# Patient Record
Sex: Male | Born: 1960 | Race: White | Hispanic: No | Marital: Married | Smoking: Current every day smoker
Health system: Southern US, Community
[De-identification: ages and names within clinical notes are randomized; demographics above are authoritative.]

---

## 2008-01-17 ENCOUNTER — Emergency Department (HOSPITAL_COMMUNITY): Admission: EM | Admit: 2008-01-17 | Discharge: 2008-01-17 | Payer: Self-pay | Admitting: Internal Medicine

## 2008-03-02 ENCOUNTER — Ambulatory Visit (HOSPITAL_COMMUNITY): Admission: RE | Admit: 2008-03-02 | Discharge: 2008-03-02 | Payer: Self-pay | Admitting: Orthopaedic Surgery

## 2010-06-01 ENCOUNTER — Encounter: Payer: Self-pay | Admitting: Orthopaedic Surgery

## 2014-08-14 ENCOUNTER — Ambulatory Visit (INDEPENDENT_AMBULATORY_CARE_PROVIDER_SITE_OTHER): Payer: BLUE CROSS/BLUE SHIELD | Admitting: Internal Medicine

## 2014-08-14 VITALS — BP 130/82 | HR 67 | Temp 97.5°F | Resp 16 | Ht 70.5 in | Wt 192.0 lb

## 2014-08-14 DIAGNOSIS — L719 Rosacea, unspecified: Secondary | ICD-10-CM

## 2014-08-14 DIAGNOSIS — L299 Pruritus, unspecified: Secondary | ICD-10-CM | POA: Diagnosis not present

## 2014-08-14 MED ORDER — MINOCYCLINE HCL 100 MG PO CAPS
100.0000 mg | ORAL_CAPSULE | Freq: Two times a day (BID) | ORAL | Status: DC
Start: 2014-08-14 — End: 2015-01-22

## 2014-08-14 MED ORDER — METRONIDAZOLE 1 % EX GEL: Freq: Every day | CUTANEOUS | Status: DC

## 2014-08-14 NOTE — Patient Instructions (Signed)
Rosacea Rosacea is a long-term (chronic) condition that affects the skin of the face (cheeks, nose, brow, and chin) and sometimes the eyes. Rosacea causes the blood vessels near the surface of the skin to enlarge, resulting in redness. This condition usually begins after age 53. It occurs most often in light-skinned women. Without treatment, rosacea tends to get worse over time. There is no cure for rosacea, but treatment can help control your symptoms. CAUSES  The cause is unknown. It is thought that some people may inherit a tendency to develop rosacea. Certain triggers can make your rosacea worse, including:  Hot baths.  Exercise.  Sunlight.  Very hot or cold temperatures.  Hot or spicy foods and drinks.  Drinking alcohol.  Stress.  Taking blood pressure medicine.  Long-term use of topical steroids on the face. SYMPTOMS   Redness of the face.  Red bumps or pimples on the face.  Red, enlarged nose (rhinophyma).  Blushing easily.  Red lines on the skin.  Irritated or burning feeling in the eyes.  Swollen eyelids. DIAGNOSIS  Your caregiver can usually tell what is wrong by asking about your symptoms and performing a physical exam. TREATMENT  Avoiding triggers is an important part of treatment. You will also need to see a skin specialist (dermatologist) who can develop a treatment plan for you. The goals of treatment are to control your condition and to improve the appearance of your skin. It may take several weeks or months of treatment before you notice an improvement in your skin. Even after your skin improves, you will likely need to continue treatment to prevent your rosacea from coming back. Treatment methods may include:  Using sunscreen or sunblock daily to protect the skin.  Antibiotic medicine, such as metronidazole, applied directly to the skin.  Antibiotics taken by mouth. This is usually prescribed if you have eye problems from your rosacea.  Laser surgery  to improve the appearance of the skin. This surgery can reduce the appearance of red lines on the skin and can remove excess tissue from the nose to reduce its size. HOME CARE INSTRUCTIONS  Avoid things that seem to trigger your flare-ups.  If you are given antibiotics, take them as directed. Finish them even if you start to feel better.  Use a gentle facial cleanser that does not contain alcohol.  You may use a mild facial moisturizer.  Use a sunscreen or sunblock with SPF 30 or greater.  Wear a green-tinted foundation powder to conceal redness, if needed. Choose cosmetics that are noncomedogenic. This means they do not block your pores.  If your eyelids are affected, apply warm compresses to the eyelids. Do this up to 4 times a day or as directed by your caregiver. SEEK MEDICAL CARE IF:  Your skin problems get worse.  You feel depressed.  You lose your appetite.  You have trouble concentrating.  You have problems with your eyes, such as redness or itching. MAKE SURE YOU:  Understand these instructions.  Will watch your condition.  Will get help right away if you are not doing well or get worse. Document Released: 06/04/2004 Document Revised: 10/27/2011 Document Reviewed: 04/07/2011 ExitCare Patient Information 2015 [employer]. This information is not intended to replace advice given to you by your health care provider. Make sure you discuss any questions you have with your health care provider.  

## 2014-08-14 NOTE — Progress Notes (Signed)
   Subjective:    Patient ID: Mark Williamson, male    DOB: 1961-01-06, 54 y.o.   MRN: [id number]  HPI  Lorina RabonDavina Galloway, CMA is scribing for Dr. Perrin MalteseGuest on 08/14/2014. Edited by me, Stevin Bielinski  A 54 year old male is here with an off and on facial rash.  Patient states he has had the rash for at least 4-5 years but has been present for the past 2 weeks.  Patient states the rash comes and goes and he does not know what causes it.  Patient mentions that the rash can stay from 1-2 days to months at a time. He adds that the rash can often go months before returning. He mentions that the rash is always on his face and never spreads to the neck, head/hair or chest.   The patient adds that rash occasionally itches and he often uses OTC creams with no relief. Some of the creams include:  Lamisil, Cortisone and Sunscreen lotion.  He describes that an hot feeling is associated with the itching.  He adds he often has pustules on his face.     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  However, he denies any knowledge of any environmental factors contributing to the rash.  He believes that the rash has gotten worse over the years.  Patient was advised he may need to visit an Dermatologist to get to the underlying cause.  Review of Systems Dr. Lajoyce LauberHosani in [facility].    Objective:   Physical Exam  Constitutional: He is oriented to person, place, and time. He appears well-developed and well-nourished.  HENT:  Head: Normocephalic and atraumatic.    Mouth/Throat: Oropharynx is clear and moist.  Has beard, lesions in beard and above on exposed skin, red papules , nose indurated with weepin surface transudate  Eyes: EOM are normal.  Neck: Normal range of motion.  Pulmonary/Chest: Effort normal.  Neurological: He is alert and oriented to person, place, and time. He exhibits normal muscle tone. Coordination normal.  Skin: Lesion and rash noted. Rash is maculopapular, nodular and  pustular. There is erythema.  Psychiatric: He has a normal mood and affect. His behavior is normal. Judgment and thought content normal.          Assessment & Plan:  Acne Rosacea likely Minocycline 100mg  bid/Metrogel daily Use zinc containing sunscreen daily Return 3 months check response/if good reduce dose of minocycline to 50mg  bid

## 2014-08-22 ENCOUNTER — Ambulatory Visit: Payer: Self-pay | Admitting: Family Medicine

## 2015-01-12 ENCOUNTER — Ambulatory Visit (INDEPENDENT_AMBULATORY_CARE_PROVIDER_SITE_OTHER): Payer: BLUE CROSS/BLUE SHIELD

## 2015-01-12 ENCOUNTER — Ambulatory Visit (INDEPENDENT_AMBULATORY_CARE_PROVIDER_SITE_OTHER): Payer: BLUE CROSS/BLUE SHIELD | Admitting: Emergency Medicine

## 2015-01-12 VITALS — BP 130/80 | HR 60 | Temp 98.4°F | Resp 18 | Ht 70.0 in | Wt 189.0 lb

## 2015-01-12 DIAGNOSIS — F17200 Nicotine dependence, unspecified, uncomplicated: Secondary | ICD-10-CM

## 2015-01-12 DIAGNOSIS — M25511 Pain in right shoulder: Secondary | ICD-10-CM

## 2015-01-12 DIAGNOSIS — M542 Cervicalgia: Secondary | ICD-10-CM

## 2015-01-12 DIAGNOSIS — Z720 Tobacco use: Secondary | ICD-10-CM | POA: Diagnosis not present

## 2015-01-12 MED ORDER — HYDROCODONE-ACETAMINOPHEN 5-325 MG PO TABS: 1.0000 | ORAL_TABLET | Freq: Four times a day (QID) | ORAL | Status: DC | PRN

## 2015-01-12 MED ORDER — PREDNISONE 20 MG PO TABS: ORAL_TABLET | ORAL | Status: DC

## 2015-01-12 NOTE — Progress Notes (Addendum)
Patient ID: Mark Williamson, male   DOB: December 20, 1960, 54 y.o.   MRN: [id number]    This chart was scribed for Collene Gobble, MD by Charline Bills, ED Scribe. The patient was seen in room 8. Patient's care was started at 1:09 PM.   Chief Complaint:  Chief Complaint  Patient presents with  . Arm Pain    C/O right arm pain since Monday    HPI: Mark Williamson is a 54 y.o. male who reports to [facility] today complaining of constant, gradually worsening right shoulder pain that radiates into right arm for the past week. Pt states that he was using a chain saw 1 week ago and woke up the following day with right shoulder pain. He states that pain began to radiate into his right arm 4 days ago and worsens every day. He further reports constant pain that is exacerbated with palpation and turning his neck. He reports h/o bulging disc in his back but denies previous neck pain. Pt is right hand dominant. Pt smokes 1 ppd.    No past medical history on file. No past surgical history on file. Social History   Social History  . Marital Status: Married    Spouse Name: N/A  . Number of Children: N/A  . Years of Education: N/A   Social History Main Topics  . Smoking status: Current Every Day Smoker -- 1.00 packs/day    Types: Cigarettes  . Smokeless tobacco: None  . Alcohol Use: 0.0 oz/week    0 Standard drinks or equivalent per week  . Drug Use: None  . Sexual Activity: Not Asked   Other Topics Concern  . None   Social History Narrative   No family history on file. No Known Allergies Prior to Admission medications   Medication Sig Start Date End Date Taking? Authorizing Provider  metroNIDAZOLE (METROGEL) 1 % gel Apply topically daily. Patient not taking: Reported on 01/12/2015 08/14/14   Jonita Albee, MD  minocycline (MINOCIN) 100 MG capsule Take 1 capsule (100 mg total) by mouth 2 (two) times daily. Patient not taking: Reported on 01/12/2015 08/14/14   Jonita Albee, MD     ROS: The  patient denies fevers, chills, night sweats, unintentional weight loss, chest pain, palpitations, wheezing, dyspnea on exertion, nausea, vomiting, abdominal pain, dysuria, hematuria, melena, numbness, weakness, or tingling. + arthralgias   All other systems have been reviewed and were otherwise negative with the exception of those mentioned in the HPI and as above.    PHYSICAL EXAM: Filed Vitals:   01/12/15 1239  BP: 130/80  Pulse: 60  Temp: 98.4 F (36.9 C)  Resp: 18   Body mass index is 27.12 kg/(m^2).   General: Alert, no acute distress HEENT:  Normocephalic, atraumatic, oropharynx patent. Eye: Nonie Hoyer [facility] Cardiovascular:  Regular rate and rhythm, no rubs murmurs or gallops.  No Carotid bruits, radial pulse intact. No pedal edema.  Respiratory: Clear to auscultation bilaterally.  No wheezes, rales, or rhonchi.  No cyanosis, no use of accessory musculature Abdominal: No organomegaly, abdomen is soft and non-tender, positive bowel sounds.  No masses. Musculoskeletal: Gait intact. No edema. Tender over R suprascapular area and R deltoid. Weak R grip strength.  Skin: No rashes. Neurologic: Facial musculature symmetric. Reflexes are 1 +. Psychiatric: Patient acts appropriately throughout our interaction. Lymphatic: No cervical or submandibular lymphadenopathy Genitourinary/Anorectal: No acute findings  LABS: No results found for this or any previous visit.   EKG/XRAY:   Primary read interpreted  by Dr. Cleta Alberts at [facility]. There are degenerative changes with osteophyte fight formation C4-C5. There are narrowed joint spaces C5-6 C6-C7. Chest x-ray shows bilateral apical thickening. There are increased interstitial markings no definite nodules or infiltrates. Shoulder film is normal.   ASSESSMENT/PLAN: Patient with a  radiculopathy down the right arm. He may have a brachial plexus stretch injury. We'll see how he does with prednisone taper. He does not have the prescriptions in the drug  database. He will be treated with prednisone taper along with hydrocodone for pain. recheck if symptoms persist.   Gross sideeffects, risk and benefits, and alternatives of medications d/w patient. Patient is aware that all medications have potential sideeffects and we are unable to predict every sideeffect or drug-drug interaction that may occur.  Lesle Chris MD 01/12/2015 1:08 PM

## 2015-01-12 NOTE — Patient Instructions (Signed)

## 2015-01-22 ENCOUNTER — Ambulatory Visit (INDEPENDENT_AMBULATORY_CARE_PROVIDER_SITE_OTHER): Payer: BLUE CROSS/BLUE SHIELD | Admitting: Family Medicine

## 2015-01-22 VITALS — BP 116/74 | HR 67 | Temp 98.0°F | Resp 18 | Ht 70.0 in | Wt 190.0 lb

## 2015-01-22 DIAGNOSIS — M542 Cervicalgia: Secondary | ICD-10-CM

## 2015-01-22 DIAGNOSIS — M5010 Cervical disc disorder with radiculopathy, unspecified cervical region: Secondary | ICD-10-CM

## 2015-01-22 DIAGNOSIS — M25511 Pain in right shoulder: Secondary | ICD-10-CM | POA: Diagnosis not present

## 2015-01-22 MED ORDER — HYDROCODONE-ACETAMINOPHEN 5-325 MG PO TABS: 1.0000 | ORAL_TABLET | Freq: Three times a day (TID) | ORAL | Status: DC | PRN

## 2015-01-22 MED ORDER — MELOXICAM 15 MG PO TABS: 15.0000 mg | ORAL_TABLET | Freq: Every day | ORAL | Status: DC

## 2015-01-22 MED ORDER — PREDNISONE 20 MG PO TABS: ORAL_TABLET | ORAL | Status: DC

## 2015-01-22 NOTE — Progress Notes (Signed)
 Chief Complaint:  Chief Complaint  Patient presents with  . Follow-up    neck pain  . Medication Refill    hydrocodone     HPI: Mark Williamson is a 54 y.o. male who reports to [facility] today complaining of  Recheck of his right arm radicular pain, he is 40 percent better. + weakness, numbness, tingling but all of that is improving. He was on norco and prednisone, he sttes when he stopped the prednisone one day later the pain got a little worse and returned No prior injuries. Xrays of c spine and also shoulder were negative from OV 01/12/2015 No MI , he is a smoker.   FINDINGS: The cervical spine is visualized to the level of C7-T1.  The vertebral body heights are maintained. The alignment is normal. The prevertebral soft tissues are normal. There is no acute fracture or static listhesis. There is degenerative disc disease throughout the cervical spine most severe at C5-6 and C6-7.  IMPRESSION: 1. Cervical spine spondylosis as described above.   Electronically Signed  By: Elige Ko  IMPRESSION: No acute radiographic abnormality of the right shoulder.   Electronically Signed  By: Trudie Reed M.D.  On: 01/12/2015 14:46   Mark Williamson is a 54 y.o. male who reports to [facility] today complaining of constant, gradually worsening right shoulder pain that radiates into right arm for the past week. Pt states that he was using a chain saw 1 week ago and woke up the following day with right shoulder pain. He states that pain began to radiate into his right arm 4 days ago and worsens every day. He further reports constant pain that is exacerbated with palpation and turning his neck. He reports h/o bulging disc in his back but denies previous neck pain. Pt is right hand dominant. Pt smokes 1 ppd.   History reviewed. No pertinent past medical history. History reviewed. No pertinent past surgical history. Social History   Social History  . Marital  Status: Married    Spouse Name: N/A  . Number of Children: N/A  . Years of Education: N/A   Social History Main Topics  . Smoking status: Current Every Day Smoker -- 1.00 packs/day    Types: Cigarettes  . Smokeless tobacco: None  . Alcohol Use: 0.0 oz/week    0 Standard drinks or equivalent per week  . Drug Use: None  . Sexual Activity: Not Asked   Other Topics Concern  . None   Social History Narrative   History reviewed. No pertinent family history. No Known Allergies Prior to Admission medications   Medication Sig Start Date End Date Taking? Authorizing Provider  HYDROcodone-acetaminophen (NORCO) 5-325 MG per tablet Take 1 tablet by mouth every 6 (six) hours as needed for moderate pain. Patient not taking: Reported on 01/22/2015 01/12/15   Collene Gobble, MD  metroNIDAZOLE (METROGEL) 1 % gel Apply topically daily. Patient not taking: Reported on 01/12/2015 08/14/14   Jonita Albee, MD  minocycline (MINOCIN) 100 MG capsule Take 1 capsule (100 mg total) by mouth 2 (two) times daily. Patient not taking: Reported on 01/12/2015 08/14/14   Jonita Albee, MD  predniSONE (DELTASONE) 20 MG tablet Take 3 a day for 3 days 2 a day for 3 days one a day for 3 days Patient not taking: Reported on 01/22/2015 01/12/15   Collene Gobble, MD     ROS: The patient denies fevers, chills, night sweats, unintentional weight loss,  chest pain, palpitations, wheezing, dyspnea on exertion, nausea, vomiting, abdominal pain, dysuria, hematuria, melena  All other systems have been reviewed and were otherwise negative with the exception of those mentioned in the HPI and as above.    PHYSICAL EXAM: Filed Vitals:   01/22/15 1742  BP: 116/74  Pulse: 67  Temp: 98 F (36.7 C)  Resp: 18   Body mass index is 27.26 kg/(m^2).   General: Alert, no acute distress HEENT:  Normocephalic, atraumatic, oropharynx patent. EOMI, PERRLA Cardiovascular:  Regular rate and rhythm, no rubs murmurs or gallops.  No Carotid bruits,  radial pulse intact. No pedal edema.  Respiratory: Clear to auscultation bilaterally.  No wheezes, rales, or rhonchi.  No cyanosis, no use of accessory musculature Abdominal: No organomegaly, abdomen is soft and non-tender, positive bowel sounds. No masses. Skin: No rashes. Neurologic: Facial musculature symmetric. Psychiatric: Patient acts appropriately throughout our interaction. Lymphatic: No cervical or submandibular lymphadenopathy Musculoskeletal: Gait intact. No edema, tenderness Neck exam-neg spurling, full ROM but pain, he has numbness nad tingling with certain ROM, tender with palptation of paramsk Shoulder No deformity, no hypertrophy/atrophy, no erythema, no fluid, no wounds Full ROM Nontender at [facility] Xxx jt Neg Empty Can test, neg Lift off test, neg Speeds, Neg Hawkins/Neers 5/5 strength, 2/2 triceps and biceps DTRs    LABS: No results found for this or any previous visit.   EKG/XRAY:   Primary read interpreted by Dr. Conley Rolls at [facility].   ASSESSMENT/PLAN: Encounter Diagnoses  Name Primary?  . Neck pain on right side Yes  . Pain in joint, shoulder region, right   . Cervical disc disorder with radiculopathy of cervical region    Will refill rpednisone taper and also refill norco, add mobic but no to take unless needed after steroids are done. He already has flexeril [place] controlled substance DB pulled, no illegal activities Fu prn   Gross sideeffects, risk and benefits, and alternatives of medications d/w patient. Patient is aware that all medications have potential sideeffects and we are unable to predict every sideeffect or drug-drug interaction that may occur.    DO  01/25/2015 8:04 AM

## 2016-02-29 IMAGING — CR DG CERVICAL SPINE 2 OR 3 VIEWS
5 series · 5 of 5 positions shown · non-contrast
Comparison: None.

CLINICAL DATA: Right-sided neck pain.

EXAM:
CERVICAL SPINE - 2-3 VIEW

[lateral]
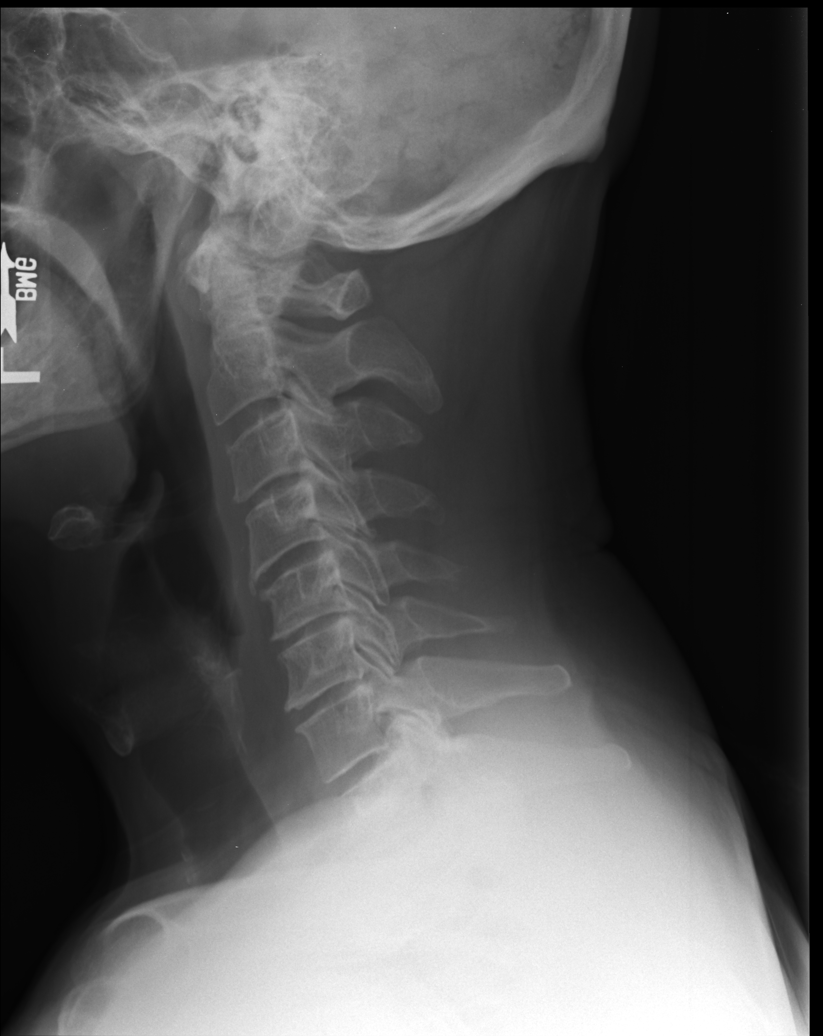

[ap open mouth (1 of 2)]
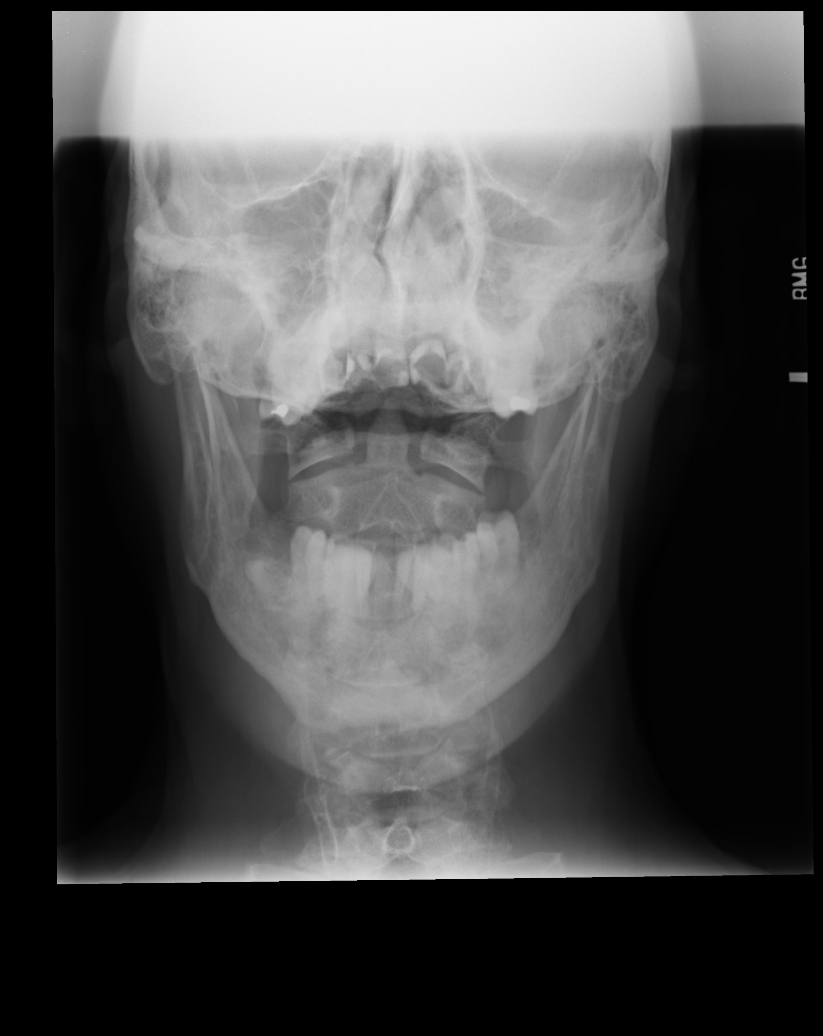

[AP]
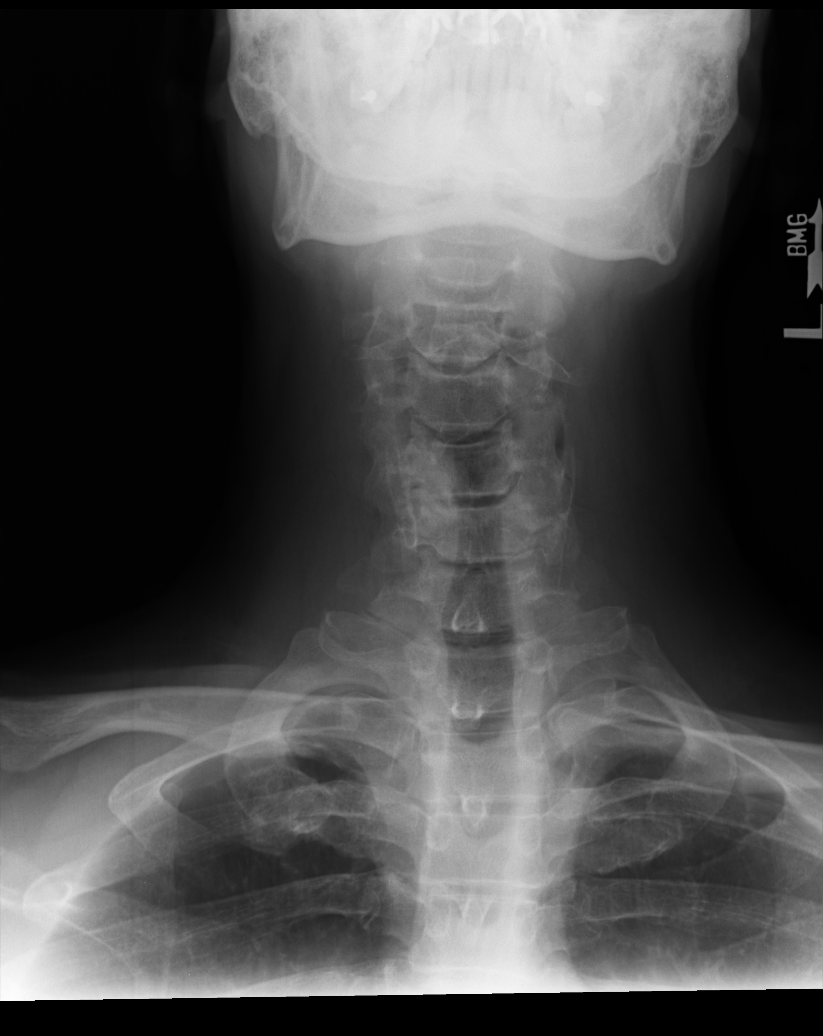

[swimmers]
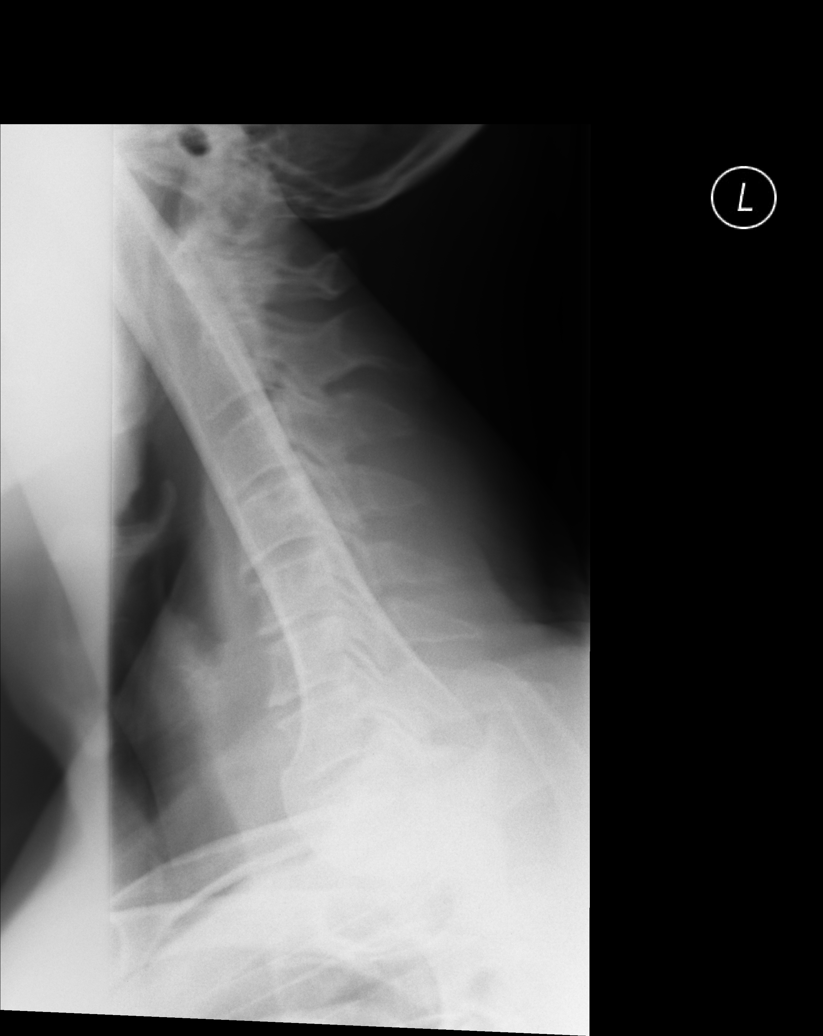

[ap open mouth (2 of 2)]
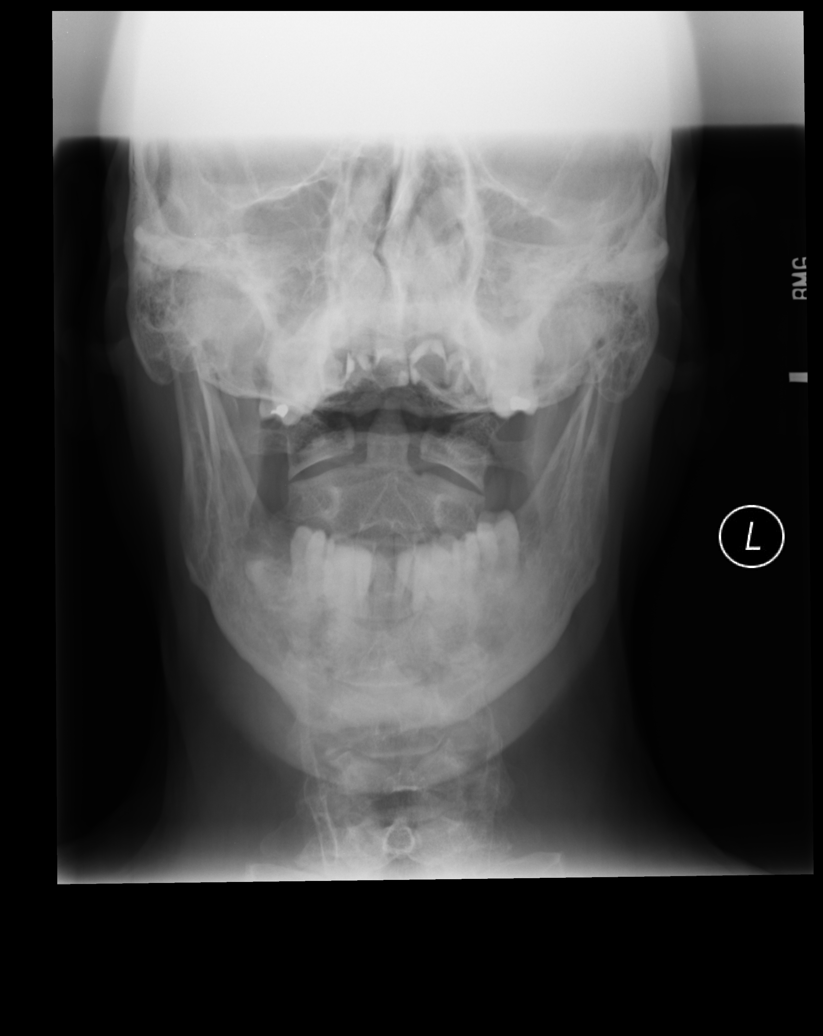

[5 of 5 positions shown; findings below may reference images not displayed]

FINDINGS: The cervical spine is visualized to the level of C7-T1.

The vertebral body heights are maintained. The alignment is normal.
The prevertebral soft tissues are normal. There is no acute fracture
or static listhesis. There is degenerative disc disease throughout
the cervical spine most severe at C5-6 and C6-7.
IMPRESSION: 1. Cervical spine spondylosis as described above.

## 2016-09-23 DIAGNOSIS — B353 Tinea pedis: Secondary | ICD-10-CM | POA: Diagnosis not present

## 2016-09-24 DIAGNOSIS — B000 Eczema herpeticum: Secondary | ICD-10-CM | POA: Diagnosis not present

## 2016-09-24 DIAGNOSIS — Z6826 Body mass index (BMI) 26.0-26.9, adult: Secondary | ICD-10-CM | POA: Diagnosis not present

## 2016-12-31 ENCOUNTER — Ambulatory Visit (INDEPENDENT_AMBULATORY_CARE_PROVIDER_SITE_OTHER): Payer: BLUE CROSS/BLUE SHIELD | Admitting: Family Medicine

## 2016-12-31 ENCOUNTER — Encounter: Payer: Self-pay | Admitting: Family Medicine

## 2016-12-31 VITALS — BP 135/86 | HR 57 | Temp 97.7°F | Resp 16 | Ht 70.0 in | Wt 191.2 lb

## 2016-12-31 DIAGNOSIS — M5126 Other intervertebral disc displacement, lumbar region: Secondary | ICD-10-CM | POA: Diagnosis not present

## 2016-12-31 MED ORDER — CYCLOBENZAPRINE HCL 5 MG PO TABS
5.0000 mg | ORAL_TABLET | Freq: Three times a day (TID) | ORAL | 0 refills | Status: AC | PRN
Start: 2016-12-31 — End: ?

## 2016-12-31 MED ORDER — MELOXICAM 15 MG PO TABS: 15.0000 mg | ORAL_TABLET | Freq: Every day | ORAL | 0 refills | Status: AC

## 2016-12-31 NOTE — Progress Notes (Signed)
   8/23/201812:57 PM  ZUBAYR LUNDRIGAN 25-May-1960, 56 y.o. male [id number]  Chief Complaint  Patient presents with  . Back Pain    lower x 2 days    HPI:   Patient is a 56 y.o. male who presents today for 2 days of mid low back pain. Patient states that he has known disc problems, has been evaluated for back surgery in the past but advised against it. Normally has no issues with his back, last flare up about 2 years ago. States he was doing his normal activity when suddenly 2 days ago he could not get out of bed due to pain, denies any radiation of pain, fever, denies any bladder or bowel problems. He took some friend's pain medication yesterday.   Depression screen [facility] 2/9 12/31/2016 01/22/2015 01/12/2015  Decreased Interest 0 0 0  Down, Depressed, Hopeless 0 0 0  PHQ - 2 Score 0 0 0    No Known Allergies  Current Outpatient Prescriptions on File Prior to Visit  Medication Sig Dispense Refill  . HYDROcodone-acetaminophen (NORCO) 5-325 MG per tablet Take 1 tablet by mouth every 8 (eight) hours as needed for moderate pain. Take with stool softener. 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 (Patient not taking: Reported on 12/31/2016) 30 tablet 0   No current facility-administered medications on file prior to visit.     History reviewed. No pertinent past medical history.  History reviewed. No pertinent surgical history.  Social History  Substance Use Topics  . Smoking status: Current Every Day Smoker    Packs/day: 1.00    Types: Cigarettes  . Smokeless tobacco: Never Used  . Alcohol use 0.0 oz/week    History reviewed. No pertinent family history.  Review of Systems  Constitutional: Negative for chills and fever.  Respiratory: Negative for cough and shortness of breath.   Gastrointestinal: Negative for abdominal pain, nausea and vomiting.  Genitourinary: Negative for dysuria.  Musculoskeletal: Positive for back pain.     OBJECTIVE:  Blood pressure 135/86, pulse  (!) 57, temperature 97.7 F (36.5 C), temperature source Oral, resp. rate 16, height 5\' 10"  (1.778 m), weight 191 lb 3.2 oz (86.7 kg), SpO2 99 %.  Physical Exam  Constitutional: He is well-developed, well-nourished, and in no distress.  Moving comfortable from and to exam table  HENT:  Head: Normocephalic and atraumatic.  Mouth/Throat: Oropharynx is clear and moist.  Eyes: Pupils are equal, round, and reactive to light. EOM are normal.  Neck: Neck supple.  Cardiovascular: Normal rate, regular rhythm, normal heart sounds and intact distal pulses.  Exam reveals no gallop and no friction rub.   No murmur heard. Pulmonary/Chest: Breath sounds normal. He has no wheezes. He has no rales.  Musculoskeletal:       Lumbar back: Normal.  Neurological: He displays no weakness and normal reflexes. He has a normal Straight Leg Raise Test. Gait normal.    No results found for this or any previous visit.   ASSESSMENT and PLAN:  1. Low back pain due to displacement of intervertebral disc Discussed conservative treatment. Discussed opiates not appropriate at this time. rx for nsaids and cyclobenzaprine given w r/se/b of each. Patient educational handout with exercises given and discussed. RTC precautions discussed.        Myles Lipps, MD Primary Care at [facility] [address] [place], [place] [address] Ph.  [phone] Fax [phone]

## 2016-12-31 NOTE — Patient Instructions (Signed)
     IF you received an x-ray today, you will receive an invoice from [facility]. Please contact [facility] at [phone] with questions or concerns regarding your invoice.   IF you received labwork today, you will receive an invoice from [facility]. Please contact [facility] at [phone] with questions or concerns regarding your invoice.   Our billing staff will not be able to assist you with questions regarding bills from these companies.  You will be contacted with the lab results as soon as they are available. The fastest way to get your results is to activate your My Chart account. Instructions are located on the last page of this paperwork. If you have not heard from us regarding the results in 2 weeks, please contact this office.     

## 2023-07-26 DIAGNOSIS — J449 Chronic obstructive pulmonary disease, unspecified: Secondary | ICD-10-CM | POA: Diagnosis not present

## 2023-07-26 DIAGNOSIS — Z125 Encounter for screening for malignant neoplasm of prostate: Secondary | ICD-10-CM | POA: Diagnosis not present

## 2023-07-26 DIAGNOSIS — I10 Essential (primary) hypertension: Secondary | ICD-10-CM | POA: Diagnosis not present

## 2023-07-26 DIAGNOSIS — Z6825 Body mass index (BMI) 25.0-25.9, adult: Secondary | ICD-10-CM | POA: Diagnosis not present

## 2024-01-24 DIAGNOSIS — I10 Essential (primary) hypertension: Secondary | ICD-10-CM | POA: Diagnosis not present

## 2024-01-24 DIAGNOSIS — J449 Chronic obstructive pulmonary disease, unspecified: Secondary | ICD-10-CM | POA: Diagnosis not present

## 2024-01-24 DIAGNOSIS — Z6824 Body mass index (BMI) 24.0-24.9, adult: Secondary | ICD-10-CM | POA: Diagnosis not present

## 2024-01-24 DIAGNOSIS — F1010 Alcohol abuse, uncomplicated: Secondary | ICD-10-CM | POA: Diagnosis not present

## 2024-01-24 DIAGNOSIS — Z0000 Encounter for general adult medical examination without abnormal findings: Secondary | ICD-10-CM | POA: Diagnosis not present

## 2024-04-24 DIAGNOSIS — F1010 Alcohol abuse, uncomplicated: Secondary | ICD-10-CM | POA: Diagnosis not present

## 2024-04-24 DIAGNOSIS — J449 Chronic obstructive pulmonary disease, unspecified: Secondary | ICD-10-CM | POA: Diagnosis not present

## 2024-04-24 DIAGNOSIS — L309 Dermatitis, unspecified: Secondary | ICD-10-CM | POA: Diagnosis not present

## 2024-04-24 DIAGNOSIS — I10 Essential (primary) hypertension: Secondary | ICD-10-CM | POA: Diagnosis not present
